# Patient Record
Sex: Female | Born: 1980 | Race: White | Hispanic: No | Marital: Single
Health system: Southern US, Community
[De-identification: ages and names within clinical notes are randomized; demographics above are authoritative.]

---

## 2004-12-25 ENCOUNTER — Ambulatory Visit: Payer: Self-pay | Admitting: Unknown Physician Specialty

## 2005-04-13 ENCOUNTER — Emergency Department: Payer: Self-pay | Admitting: Emergency Medicine

## 2005-05-14 ENCOUNTER — Ambulatory Visit: Payer: Self-pay | Admitting: Unknown Physician Specialty

## 2007-08-11 ENCOUNTER — Emergency Department: Payer: Self-pay | Admitting: Emergency Medicine

## 2010-08-02 HISTORY — PX: AUGMENTATION MAMMAPLASTY: SUR837

## 2014-01-12 ENCOUNTER — Emergency Department: Payer: Self-pay | Admitting: Emergency Medicine

## 2014-02-27 ENCOUNTER — Ambulatory Visit: Payer: Self-pay | Admitting: Neurology

## 2015-08-21 ENCOUNTER — Ambulatory Visit: Payer: BLUE CROSS/BLUE SHIELD | Admitting: Physical Therapy

## 2015-08-25 ENCOUNTER — Ambulatory Visit: Payer: BLUE CROSS/BLUE SHIELD | Admitting: Physical Therapy

## 2015-08-28 ENCOUNTER — Other Ambulatory Visit: Payer: Self-pay | Admitting: Neurology

## 2015-08-28 ENCOUNTER — Encounter: Payer: BLUE CROSS/BLUE SHIELD | Admitting: Physical Therapy

## 2015-08-28 DIAGNOSIS — M542 Cervicalgia: Secondary | ICD-10-CM

## 2015-09-01 ENCOUNTER — Encounter: Payer: BLUE CROSS/BLUE SHIELD | Admitting: Physical Therapy

## 2015-09-04 ENCOUNTER — Encounter: Payer: BLUE CROSS/BLUE SHIELD | Admitting: Physical Therapy

## 2015-09-05 ENCOUNTER — Ambulatory Visit
Admission: RE | Admit: 2015-09-05 | Discharge: 2015-09-05 | Disposition: A | Discharge status: Home / Self Care | Payer: BLUE CROSS/BLUE SHIELD | Source: Ambulatory Visit | Attending: Neurology | Admitting: Neurology

## 2015-09-05 DIAGNOSIS — M542 Cervicalgia: Secondary | ICD-10-CM

## 2016-05-18 ENCOUNTER — Other Ambulatory Visit: Payer: Self-pay | Admitting: Obstetrics and Gynecology

## 2016-05-18 DIAGNOSIS — N6312 Unspecified lump in the right breast, upper inner quadrant: Secondary | ICD-10-CM

## 2016-06-07 ENCOUNTER — Other Ambulatory Visit: Payer: Self-pay | Admitting: Obstetrics and Gynecology

## 2016-06-07 ENCOUNTER — Ambulatory Visit
Admission: RE | Admit: 2016-06-07 | Discharge: 2016-06-07 | Disposition: A | Discharge status: Home / Self Care | Payer: BLUE CROSS/BLUE SHIELD | Source: Ambulatory Visit | Attending: Obstetrics and Gynecology | Admitting: Obstetrics and Gynecology

## 2016-06-07 ENCOUNTER — Encounter: Payer: Self-pay | Admitting: Radiology

## 2016-06-07 DIAGNOSIS — N6002 Solitary cyst of left breast: Secondary | ICD-10-CM | POA: Insufficient documentation

## 2016-06-07 DIAGNOSIS — N6312 Unspecified lump in the right breast, upper inner quadrant: Secondary | ICD-10-CM

## 2016-06-07 DIAGNOSIS — N6489 Other specified disorders of breast: Secondary | ICD-10-CM | POA: Insufficient documentation

## 2016-06-22 ENCOUNTER — Other Ambulatory Visit: Payer: Self-pay | Admitting: Obstetrics and Gynecology

## 2016-06-22 DIAGNOSIS — N63 Unspecified lump in unspecified breast: Secondary | ICD-10-CM

## 2016-12-15 ENCOUNTER — Encounter: Payer: Self-pay | Admitting: Radiology

## 2016-12-15 ENCOUNTER — Ambulatory Visit
Admission: RE | Admit: 2016-12-15 | Discharge: 2016-12-15 | Disposition: A | Discharge status: Home / Self Care | Payer: BLUE CROSS/BLUE SHIELD | Source: Ambulatory Visit | Attending: Obstetrics and Gynecology | Admitting: Obstetrics and Gynecology

## 2016-12-15 DIAGNOSIS — N6320 Unspecified lump in the left breast, unspecified quadrant: Secondary | ICD-10-CM | POA: Diagnosis present

## 2016-12-15 DIAGNOSIS — N63 Unspecified lump in unspecified breast: Secondary | ICD-10-CM

## 2016-12-20 ENCOUNTER — Encounter: Payer: Self-pay | Admitting: Radiology

## 2017-08-23 ENCOUNTER — Other Ambulatory Visit: Payer: Self-pay | Admitting: Internal Medicine

## 2017-08-23 DIAGNOSIS — R109 Unspecified abdominal pain: Secondary | ICD-10-CM

## 2017-08-25 ENCOUNTER — Ambulatory Visit: Payer: BLUE CROSS/BLUE SHIELD

## 2018-03-02 ENCOUNTER — Ambulatory Visit: Payer: BLUE CROSS/BLUE SHIELD

## 2018-05-22 ENCOUNTER — Other Ambulatory Visit: Payer: Self-pay

## 2018-05-22 ENCOUNTER — Emergency Department
Admission: EM | Admit: 2018-05-22 | Discharge: 2018-05-22 | Disposition: A | Discharge status: Home / Self Care | Payer: BLUE CROSS/BLUE SHIELD | Attending: Emergency Medicine | Admitting: Emergency Medicine

## 2018-05-22 ENCOUNTER — Emergency Department: Payer: BLUE CROSS/BLUE SHIELD

## 2018-05-22 DIAGNOSIS — O208 Other hemorrhage in early pregnancy: Secondary | ICD-10-CM

## 2018-05-22 DIAGNOSIS — N939 Abnormal uterine and vaginal bleeding, unspecified: Secondary | ICD-10-CM

## 2018-05-22 DIAGNOSIS — O209 Hemorrhage in early pregnancy, unspecified: Secondary | ICD-10-CM | POA: Diagnosis present

## 2018-05-22 DIAGNOSIS — Z3A01 Less than 8 weeks gestation of pregnancy: Secondary | ICD-10-CM | POA: Insufficient documentation

## 2018-05-22 LAB — CBC
HCT: 42.4 % (ref 36.0–46.0)
HEMOGLOBIN: 14 g/dL (ref 12.0–15.0)
MCH: 30.8 pg (ref 26.0–34.0)
MCHC: 33 g/dL (ref 30.0–36.0)
MCV: 93.4 fL (ref 80.0–100.0)
PLATELETS: 305 10*3/uL (ref 150–400)
RBC: 4.54 MIL/uL (ref 3.87–5.11)
RDW: 11.9 % (ref 11.5–15.5)
WBC: 7.4 10*3/uL (ref 4.0–10.5)
nRBC: 0 % (ref 0.0–0.2)

## 2018-05-22 LAB — COMPREHENSIVE METABOLIC PANEL
ALK PHOS: 49 U/L (ref 38–126)
ALT: 16 U/L (ref 0–44)
AST: 15 U/L (ref 15–41)
Albumin: 4.1 g/dL (ref 3.5–5.0)
Anion gap: 2 — ABNORMAL LOW (ref 5–15)
BUN: 14 mg/dL (ref 6–20)
CHLORIDE: 106 mmol/L (ref 98–111)
CO2: 29 mmol/L (ref 22–32)
CREATININE: 0.75 mg/dL (ref 0.44–1.00)
Calcium: 9 mg/dL (ref 8.9–10.3)
GFR calc Af Amer: >60 mL/min (ref >60)
Glucose, Bld: 106 mg/dL — ABNORMAL HIGH (ref 70–99)
Potassium: 4.6 mmol/L (ref 3.5–5.1)
Sodium: 137 mmol/L (ref 135–145)
Total Bilirubin: 0.4 mg/dL (ref 0.3–1.2)
Total Protein: 7.5 g/dL (ref 6.5–8.1)

## 2018-05-22 LAB — URINALYSIS, COMPLETE (UACMP) WITH MICROSCOPIC
Bacteria, UA: NONE SEEN
Bilirubin Urine: NEGATIVE
Glucose, UA: NEGATIVE mg/dL
Ketones, ur: NEGATIVE mg/dL
Leukocytes, UA: NEGATIVE
Nitrite: NEGATIVE
PROTEIN: NEGATIVE mg/dL
Specific Gravity, Urine: 1.018 (ref 1.005–1.030)
pH: 5 (ref 5.0–8.0)

## 2018-05-22 LAB — HCG, QUANTITATIVE, PREGNANCY: HCG, BETA CHAIN, QUANT, S: 3607 m[IU]/mL — AB (ref <5)

## 2018-05-22 LAB — POCT PREGNANCY, URINE: Preg Test, Ur: POSITIVE — AB

## 2018-05-22 LAB — ABO/RH: ABO/RH(D): A POS

## 2018-05-22 NOTE — ED Provider Notes (Signed)
[facility] Emergency Department Provider Note  ___________________________________________   First MD Initiated Contact with Patient 05/22/18 2124     (approximate)  I have reviewed the triage vital signs and the nursing notes.   HISTORY  Chief Complaint Vaginal Bleeding   HPI Sonya Clarke is a 37 y.o. female without any chronic medical conditions was presented to the emergency department today with vaginal bleeding.  She says that the bleeding started at 8 PM and was associated with mild cramping to the lower abdomen.  She states that she had clots that were several centimeters across.  Does not report any tissue loss.  States this is her first pregnancy and she calculated by her last menses that she was approximately 6 weeks but then went to her OB/GYN appointment this morning and measured at only 5 weeks.   No past medical history on file.  There are no active problems to display for this patient.   Past Surgical History:  Procedure Laterality Date  . AUGMENTATION MAMMAPLASTY Bilateral 2012   SALINE - RETRO    Prior to Admission medications   Not on File    Allergies Sulfa antibiotics  Family History  Problem Relation Age of Onset  . Breast cancer Neg Hx     Social History Social History   Tobacco Use  . Smoking status: Not on file  Substance Use Topics  . Alcohol use: Not on file  . Drug use: Not on file    Review of Systems  Constitutional: No fever/chills Eyes: No visual changes. ENT: No sore throat. Cardiovascular: Denies chest pain. Respiratory: Denies shortness of breath. Gastrointestinal:  No nausea, no vomiting.  No diarrhea.  No constipation. Genitourinary: As above Musculoskeletal: Negative for back pain. Skin: Negative for rash. Neurological: Negative for headaches, focal weakness or numbness.   ____________________________________________   PHYSICAL EXAM:  VITAL SIGNS: ED Triage Vitals [05/22/18  2027]  Enc Vitals Group     BP 135/77     Pulse Rate 67     Resp 20     Temp 97.7 F (36.5 C)     Temp Source Oral     SpO2 100 %     Weight 162 lb (73.5 kg)     Height 5\' 3"  (1.6 m)     Head Circumference      Peak Flow      Pain Score 4     Pain Loc      Pain Edu?      Excl. in GC?     Constitutional: Alert and oriented. Well appearing and in no acute distress. Eyes: Conjunctivae are normal.  Head: Atraumatic. Nose: No congestion/rhinnorhea. Mouth/Throat: Mucous membranes are moist.  Neck: No stridor.   Cardiovascular: Normal rate, regular rhythm. Grossly normal heart sounds.   Respiratory: Normal respiratory effort.  No retractions. Lungs CTAB. Gastrointestinal: Soft and nontender. No distention. Genitourinary: No lesions on the external genitalia.  However, there is a small amount of maroon blood.  On speculum exam there is a small amount of clot at the cervix.  It was removed with a large Q-tip and there is no active bleeding from the cervix.  No discharge visualized.  Bimanual exam with a closed cervical loss.  No uterine or adnexal tenderness nor masses palpated. Musculoskeletal: No lower extremity tenderness nor edema.  No joint effusions. Neurologic:  Normal speech and language. No gross focal neurologic deficits are appreciated. Skin:  Skin is warm, dry and intact. No  rash noted. Psychiatric: Mood and affect are normal. Speech and behavior are normal.  ____________________________________________   LABS (all labs ordered are listed, but only abnormal results are displayed)  Labs Reviewed  COMPREHENSIVE METABOLIC PANEL - Abnormal; Notable for the following components:      Result Value   Glucose, Bld 106 (*)    Anion gap 2 (*)    All other components within normal limits  HCG, QUANTITATIVE, PREGNANCY - Abnormal; Notable for the following components:   hCG, Beta Chain, Quant, S 3,607 (*)    All other components within normal limits  URINALYSIS, COMPLETE (UACMP)  WITH MICROSCOPIC - Abnormal; Notable for the following components:   Color, Urine YELLOW (*)    APPearance HAZY (*)    Hgb urine dipstick LARGE (*)    RBC / HPF >50 (*)    All other components within normal limits  POCT PREGNANCY, URINE - Abnormal; Notable for the following components:   Preg Test, Ur POSITIVE (*)    All other components within normal limits  CBC  POC URINE PREG, ED  ABO/RH   ____________________________________________  EKG   ____________________________________________  RADIOLOGY  Ultrasound with single IUP with visible gestational sac and yolk sac but no embryo.  Follow-up ultrasound suggested in 10 to 14 days. ____________________________________________   PROCEDURES  Procedure(s) performed:   Procedures  Critical Care performed:   ____________________________________________   INITIAL IMPRESSION / ASSESSMENT AND PLAN / ED COURSE  Pertinent labs & imaging results that were available during my care of the patient were reviewed by me and considered in my medical decision making (see chart for details).  Differential diagnosis includes, but is not limited to, threatened miscarriage, incomplete miscarriage, normal bleeding from an early trimester pregnancy, ectopic pregnancy, , blighted ovum, vaginal/cervical trauma, subchorionic hemorrhage/hematoma, etc. As part of my medical decision making, I reviewed the following data within the electronic MEDICAL RECORD NUMBERReviewed outpatient visit earlier today with Dr. Dalbert Garnet.  ----------------------------------------- 10:04 PM on 05/22/2018 -----------------------------------------  Patient has been updated about test results as well as exam findings.  We discussed the pregnancies still appears intact but that this bleeding could progress to miscarriage or the pregnancy could continue and develop normally.  Patient has already been in contact with her OB/GYN and will be following up for further evaluation.  A  positive blood type.  Will not require RhoGam.  Patient be discharged at this time.  We also discussed pelvic rest.  Patient understand the diagnosis as well as treatment plan willing to comply.  Patient taking prenatal vitamins at this time. ____________________________________________   FINAL CLINICAL IMPRESSION(S) / ED DIAGNOSES  Final diagnoses:  Vaginal bleeding   First trimester vaginal bleeding.   NEW MEDICATIONS STARTED DURING THIS VISIT:  New Prescriptions   No medications on file     Note:  This document was prepared using Dragon voice recognition software and may include unintentional dictation errors.     Myrna Blazer, MD 05/22/18 [phone]

## 2018-05-22 NOTE — ED Notes (Signed)
Pt ambulatory to POV without difficulty. VSS. NAD. Discharge instructions, and follow up discussed. All questions and concerns addressed.  

## 2018-05-22 NOTE — ED Triage Notes (Signed)
Pt is [redacted] weeks pregnant with vaginal bleeding that started today. States P-1 also having abd cramping.

## 2019-12-04 ENCOUNTER — Other Ambulatory Visit: Payer: Self-pay | Admitting: Internal Medicine

## 2019-12-04 DIAGNOSIS — R1032 Left lower quadrant pain: Secondary | ICD-10-CM

## 2020-09-19 ENCOUNTER — Other Ambulatory Visit: Payer: Self-pay | Admitting: Internal Medicine

## 2020-09-19 DIAGNOSIS — R1010 Upper abdominal pain, unspecified: Secondary | ICD-10-CM

## 2020-09-24 ENCOUNTER — Ambulatory Visit: Payer: 59 | Attending: Internal Medicine

## 2021-05-28 ENCOUNTER — Other Ambulatory Visit
Admission: RE | Admit: 2021-05-28 | Discharge: 2021-05-28 | Disposition: A | Discharge status: Home / Self Care | Payer: 59 | Source: Ambulatory Visit | Attending: Pulmonary Disease | Admitting: Pulmonary Disease

## 2021-05-28 DIAGNOSIS — R0689 Other abnormalities of breathing: Secondary | ICD-10-CM | POA: Diagnosis present

## 2021-05-28 DIAGNOSIS — R0600 Dyspnea, unspecified: Secondary | ICD-10-CM | POA: Diagnosis present

## 2021-05-28 LAB — D-DIMER, QUANTITATIVE: D-Dimer, Quant: 0.54 ug/mL-FEU — ABNORMAL HIGH (ref 0.00–0.50)

## 2021-05-29 ENCOUNTER — Other Ambulatory Visit: Payer: Self-pay | Admitting: Pulmonary Disease

## 2021-05-29 DIAGNOSIS — R0602 Shortness of breath: Secondary | ICD-10-CM

## 2021-05-29 DIAGNOSIS — R7989 Other specified abnormal findings of blood chemistry: Secondary | ICD-10-CM

## 2021-06-03 ENCOUNTER — Ambulatory Visit
Admission: RE | Admit: 2021-06-03 | Discharge: 2021-06-03 | Disposition: A | Discharge status: Home / Self Care | Payer: 59 | Source: Ambulatory Visit | Attending: Pulmonary Disease | Admitting: Pulmonary Disease

## 2021-06-03 ENCOUNTER — Other Ambulatory Visit: Payer: Self-pay

## 2021-06-03 DIAGNOSIS — R0602 Shortness of breath: Secondary | ICD-10-CM | POA: Diagnosis present

## 2021-06-03 DIAGNOSIS — R7989 Other specified abnormal findings of blood chemistry: Secondary | ICD-10-CM | POA: Diagnosis not present

## 2021-06-03 MED ORDER — IOHEXOL 350 MG/ML SOLN
75.0000 mL | Freq: Once | INTRAVENOUS | Status: AC | PRN
  Administered 2021-06-03: 75 mL via INTRAVENOUS

## 2021-06-08 ENCOUNTER — Ambulatory Visit
Admission: RE | Admit: 2021-06-08 | Discharge: 2021-06-08 | Disposition: A | Discharge status: Home / Self Care | Payer: 59 | Source: Ambulatory Visit | Attending: Internal Medicine | Admitting: Internal Medicine

## 2021-06-08 ENCOUNTER — Other Ambulatory Visit: Payer: Self-pay

## 2021-06-08 DIAGNOSIS — R1010 Upper abdominal pain, unspecified: Secondary | ICD-10-CM | POA: Diagnosis present

## 2021-08-05 DIAGNOSIS — K2970 Gastritis, unspecified, without bleeding: Secondary | ICD-10-CM | POA: Diagnosis not present

## 2021-08-05 DIAGNOSIS — K449 Diaphragmatic hernia without obstruction or gangrene: Secondary | ICD-10-CM | POA: Diagnosis not present

## 2021-08-05 DIAGNOSIS — K2950 Unspecified chronic gastritis without bleeding: Secondary | ICD-10-CM | POA: Diagnosis not present

## 2021-08-12 DIAGNOSIS — Z79899 Other long term (current) drug therapy: Secondary | ICD-10-CM | POA: Diagnosis not present

## 2021-08-12 DIAGNOSIS — R002 Palpitations: Secondary | ICD-10-CM | POA: Diagnosis not present

## 2021-08-12 DIAGNOSIS — E538 Deficiency of other specified B group vitamins: Secondary | ICD-10-CM | POA: Diagnosis not present

## 2021-08-12 DIAGNOSIS — R69 Illness, unspecified: Secondary | ICD-10-CM | POA: Diagnosis not present

## 2021-09-30 DIAGNOSIS — F419 Anxiety disorder, unspecified: Secondary | ICD-10-CM | POA: Diagnosis not present

## 2021-09-30 DIAGNOSIS — R69 Illness, unspecified: Secondary | ICD-10-CM | POA: Diagnosis not present

## 2021-09-30 DIAGNOSIS — E538 Deficiency of other specified B group vitamins: Secondary | ICD-10-CM | POA: Diagnosis not present

## 2021-09-30 DIAGNOSIS — F321 Major depressive disorder, single episode, moderate: Secondary | ICD-10-CM | POA: Diagnosis not present

## 2021-09-30 DIAGNOSIS — G44229 Chronic tension-type headache, not intractable: Secondary | ICD-10-CM | POA: Diagnosis not present

## 2021-11-02 DIAGNOSIS — E538 Deficiency of other specified B group vitamins: Secondary | ICD-10-CM | POA: Diagnosis not present

## 2021-11-25 DIAGNOSIS — E538 Deficiency of other specified B group vitamins: Secondary | ICD-10-CM | POA: Diagnosis not present

## 2021-11-25 DIAGNOSIS — G44229 Chronic tension-type headache, not intractable: Secondary | ICD-10-CM | POA: Diagnosis not present

## 2021-11-25 DIAGNOSIS — R69 Illness, unspecified: Secondary | ICD-10-CM | POA: Diagnosis not present

## 2021-11-25 DIAGNOSIS — R0602 Shortness of breath: Secondary | ICD-10-CM | POA: Diagnosis not present

## 2021-11-25 DIAGNOSIS — Z79899 Other long term (current) drug therapy: Secondary | ICD-10-CM | POA: Diagnosis not present

## 2021-11-25 DIAGNOSIS — R7989 Other specified abnormal findings of blood chemistry: Secondary | ICD-10-CM | POA: Diagnosis not present

## 2021-11-25 DIAGNOSIS — R55 Syncope and collapse: Secondary | ICD-10-CM | POA: Diagnosis not present

## 2021-12-04 DIAGNOSIS — E538 Deficiency of other specified B group vitamins: Secondary | ICD-10-CM | POA: Diagnosis not present

## 2022-01-08 DIAGNOSIS — E538 Deficiency of other specified B group vitamins: Secondary | ICD-10-CM | POA: Diagnosis not present

## 2022-03-19 ENCOUNTER — Other Ambulatory Visit: Payer: Self-pay | Admitting: Internal Medicine

## 2022-03-19 DIAGNOSIS — N6341 Unspecified lump in right breast, subareolar: Secondary | ICD-10-CM

## 2022-03-22 ENCOUNTER — Other Ambulatory Visit: Payer: Self-pay | Admitting: Internal Medicine

## 2022-03-22 DIAGNOSIS — N6341 Unspecified lump in right breast, subareolar: Secondary | ICD-10-CM

## 2022-04-12 ENCOUNTER — Other Ambulatory Visit: Payer: Self-pay | Admitting: Internal Medicine

## 2022-04-12 ENCOUNTER — Ambulatory Visit
Admission: RE | Admit: 2022-04-12 | Discharge: 2022-04-12 | Disposition: A | Discharge status: Home / Self Care | Payer: 59 | Source: Ambulatory Visit | Attending: Internal Medicine | Admitting: Internal Medicine

## 2022-04-12 DIAGNOSIS — N6341 Unspecified lump in right breast, subareolar: Secondary | ICD-10-CM

## 2022-08-23 IMAGING — US US ABDOMEN COMPLETE
1 series · 15 of 25 positions shown · non-contrast
Comparison: None.

CLINICAL DATA: Upper abdominal pain

EXAM:
ABDOMEN ULTRASOUND COMPLETE

[Series 1: us abdomen complete · 15 of 71 slices shown]
[im 1/71]
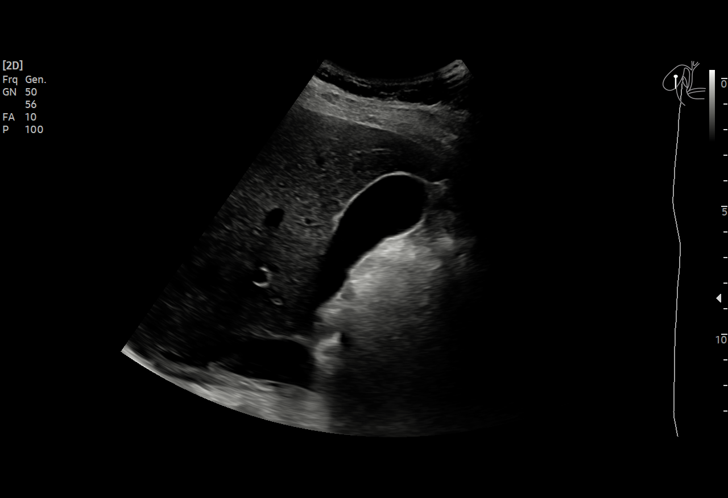
[im 6/71]
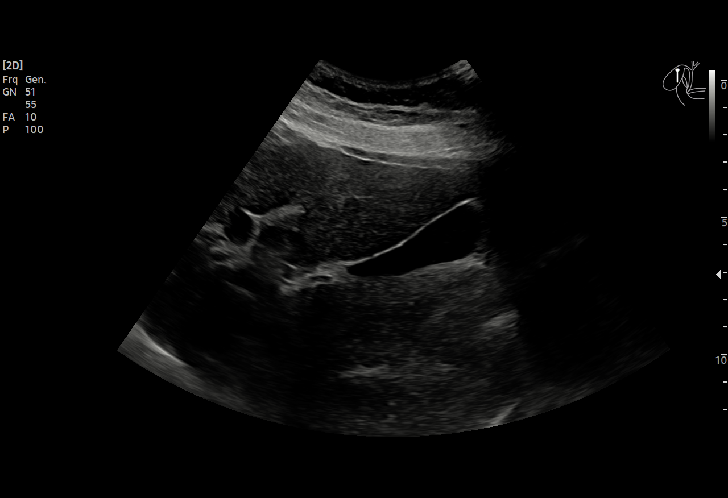
[im 12/71]
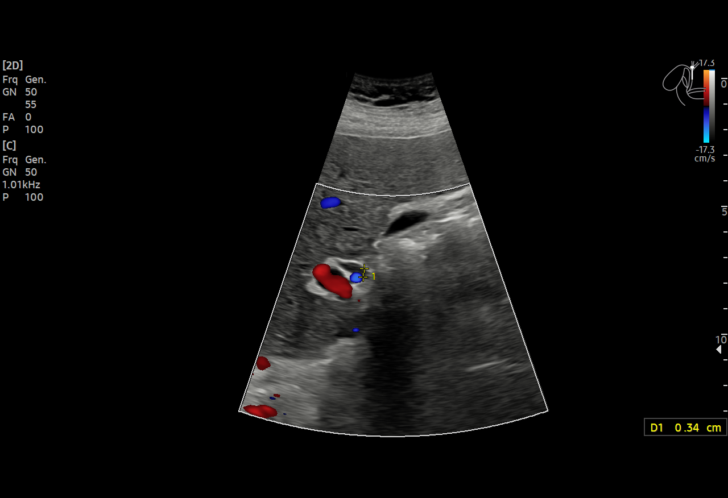
[im 15/71]
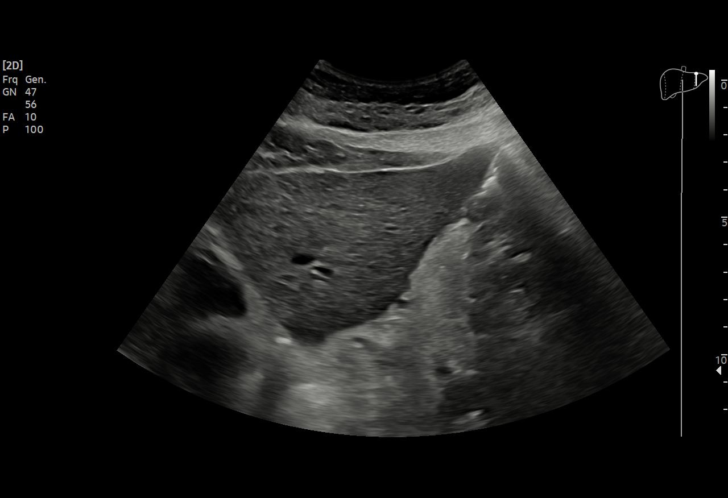
[im 21/71]
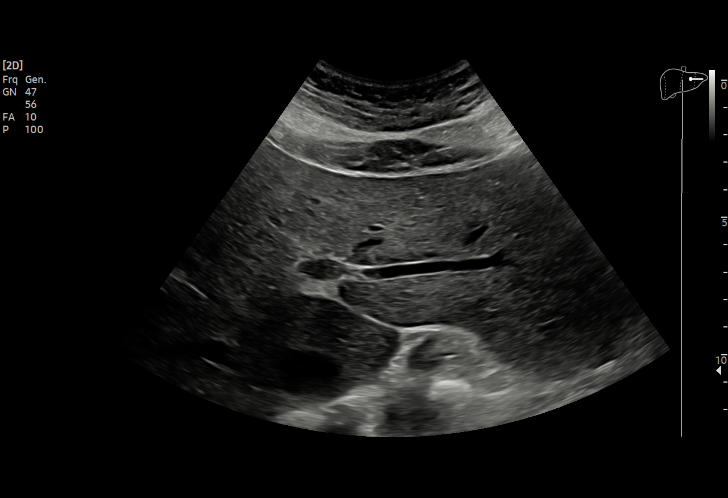
[im 27/71]
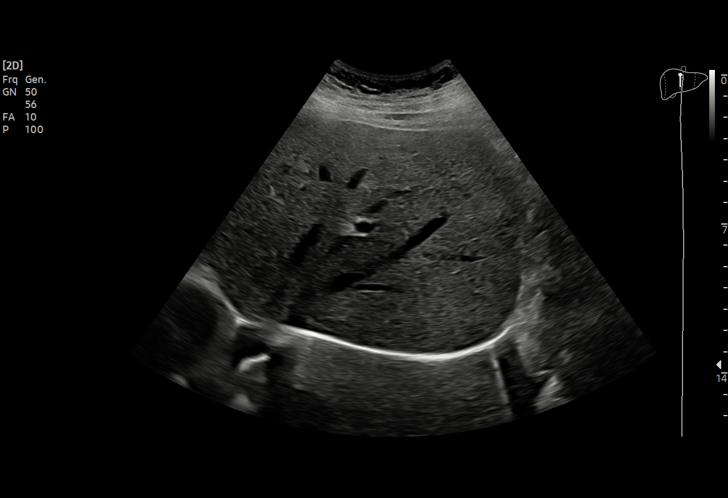
[im 30/71]
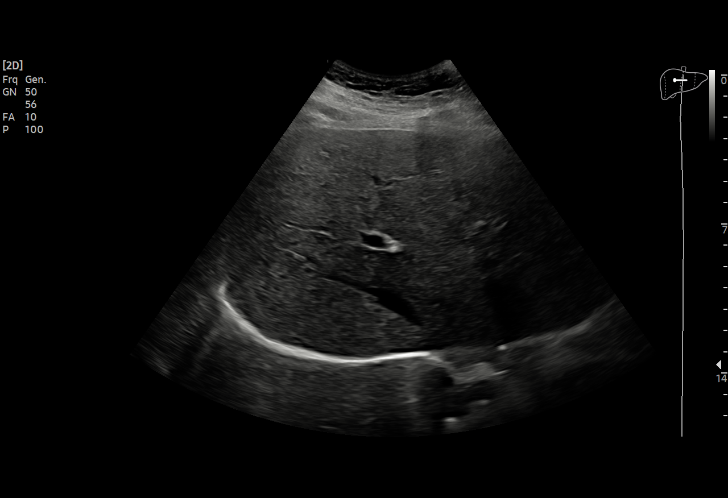
[im 36/71]
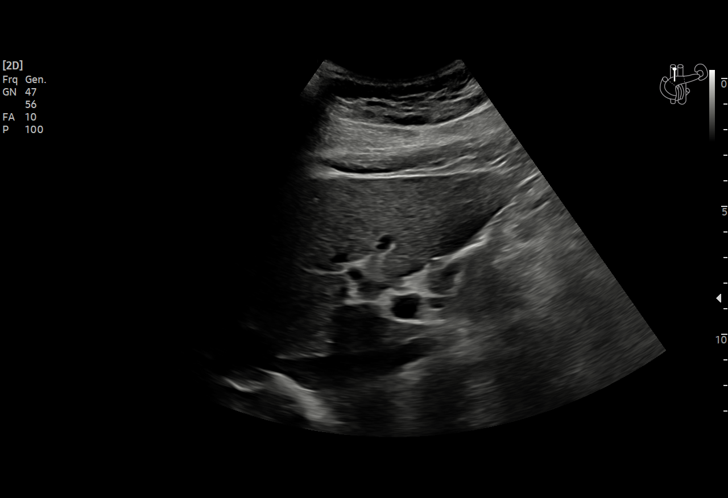
[im 41/71]
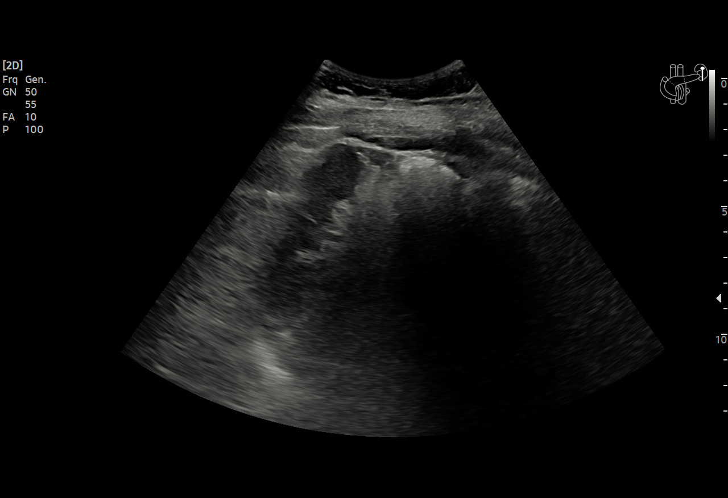
[im 44/71]
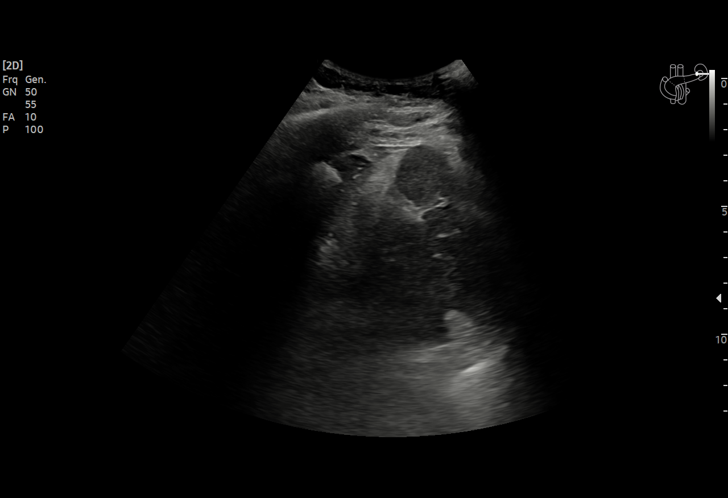
[im 50/71]
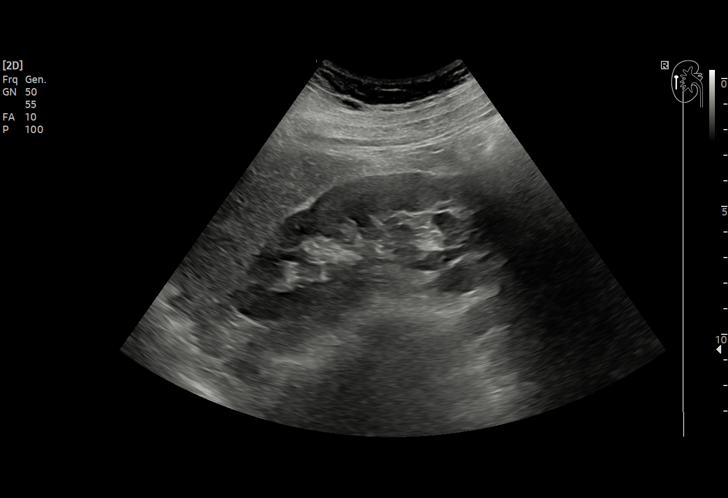
[im 56/71]
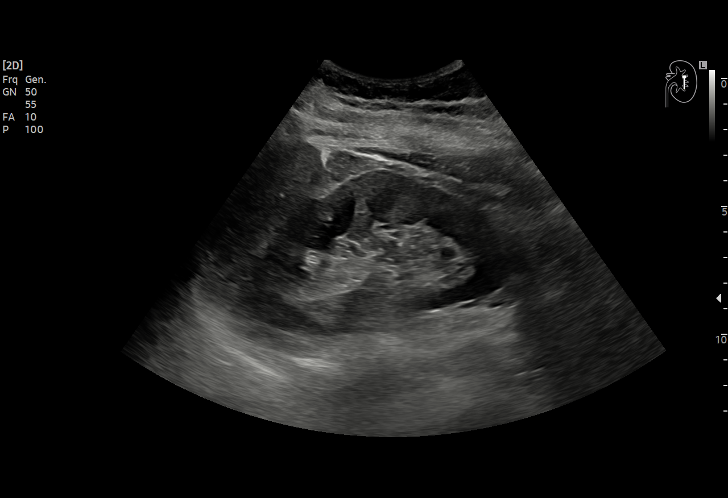
[im 59/71]
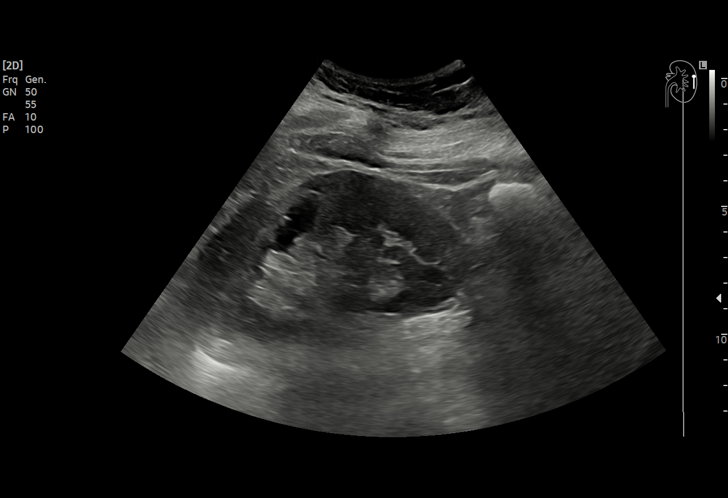
[im 65/71]
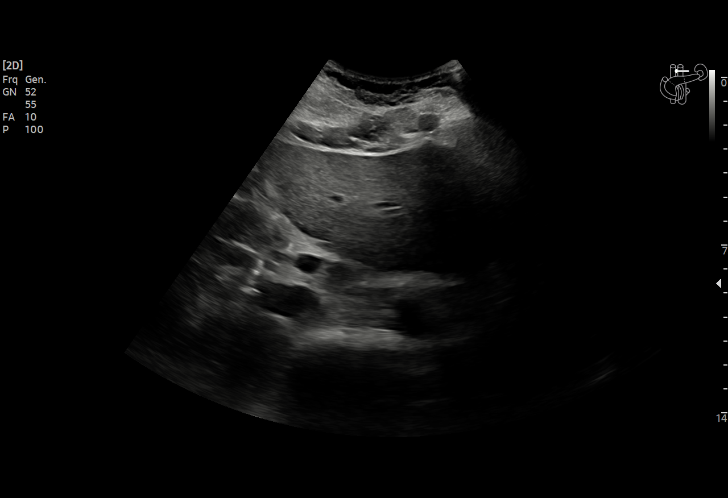
[im 71/71]
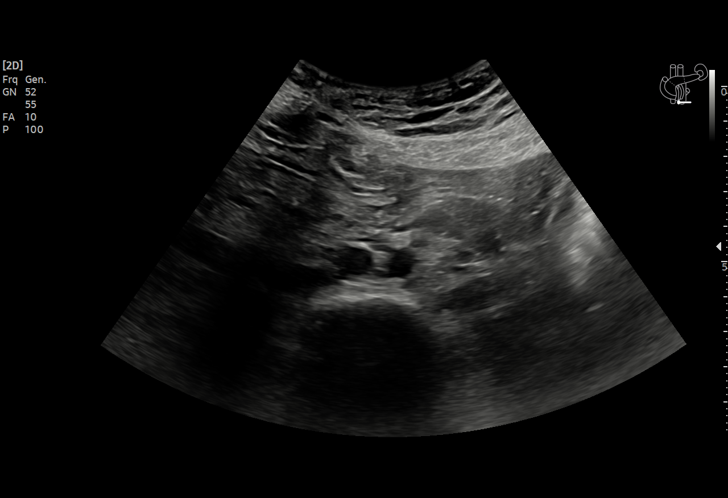

[15 of 25 positions shown; findings below may reference images not displayed]

FINDINGS: Gallbladder: No gallstones or wall thickening visualized. No
sonographic Murphy sign noted by sonographer.

Common bile duct: Diameter: 3 mm

Liver: Liver slightly echogenic. No focal hepatic abnormality.
Portal vein is patent on color Doppler imaging with normal direction
of blood flow towards the liver.

IVC: No abnormality visualized.

Pancreas: Visualized portion unremarkable.

Spleen: Size and appearance within normal limits.

Right Kidney: Length: 10.5 cm. Echogenicity within normal limits. No
mass or hydronephrosis visualized.

Left Kidney: Length: 10.7 cm. Echogenicity within normal limits. No
mass or hydronephrosis visualized.

Abdominal aorta: No aneurysm visualized.

Other findings: None.
IMPRESSION: 1. Negative for gallstones.
2. Slightly echogenic liver as may be seen with steatosis.

## 2023-11-09 ENCOUNTER — Emergency Department

## 2023-11-09 ENCOUNTER — Other Ambulatory Visit: Payer: Self-pay

## 2023-11-09 DIAGNOSIS — T8543XA Leakage of breast prosthesis and implant, initial encounter: Secondary | ICD-10-CM | POA: Diagnosis present

## 2023-11-09 DIAGNOSIS — N644 Mastodynia: Secondary | ICD-10-CM | POA: Insufficient documentation

## 2023-11-09 NOTE — ED Triage Notes (Signed)
 Pt reports her left breast implant ruptured, pt states she has had bilateral breast pain all day today. Pt is on day 1 of her menstrual cycle. Pt noticed her left breast smaller than right.

## 2023-11-10 ENCOUNTER — Other Ambulatory Visit: Payer: Self-pay

## 2023-11-10 ENCOUNTER — Emergency Department
Admission: EM | Admit: 2023-11-10 | Discharge: 2023-11-10 | Disposition: A | Discharge status: Home / Self Care | Attending: Emergency Medicine | Admitting: Emergency Medicine

## 2023-11-10 DIAGNOSIS — T8543XA Leakage of breast prosthesis and implant, initial encounter: Secondary | ICD-10-CM

## 2023-11-10 NOTE — ED Provider Notes (Signed)
 [name]    Event Date/Time   First MD Initiated Contact with Patient 11/10/23 0246     (approximate)   History   Breast Problem   HPI  Sonya Clarke is a 43 year old female presenting to the emergency department for evaluation of breast discomfort.  Patient reports that she is currently on her menstrual cycle and had discomfort over her bilateral breasts.  She was massaging her left breast when she felt like her implant ruptured.  Since that time, her left breast does not feel as full as her right side.  Reports mild discomfort in this area, otherwise denies chest pain or shortness of breath.  Reports that she had bilateral saline breast implants placed in 2012 by a doctor in [place].  No history of prior complications.    Physical Exam   Triage Vital Signs: ED Triage Vitals  Encounter Vitals Group     BP 11/09/23 2341 (!) 147/89     Systolic BP Percentile --      Diastolic BP Percentile --      Pulse Rate 11/09/23 2341 83     Resp 11/09/23 2341 18     Temp 11/09/23 2341 99.1 F (37.3 C)     Temp Source 11/09/23 2341 Oral     SpO2 11/09/23 2341 100 %     Weight 11/09/23 2340 161 lb (73 kg)     Height 11/09/23 2340 5\' 3"  (1.6 m)     Head Circumference --      Peak Flow --      Pain Score 11/09/23 2340 0     Pain Loc --      Pain Education --      Exclude from Growth Chart --     Most recent vital signs: Vitals:   11/09/23 2341 11/10/23 0300  BP: (!) 147/89 106/85  Pulse: 83 (!) 56  Resp: 18 17  Temp: 99.1 F (37.3 C)   SpO2: 100% 96%     General: Awake, interactive  CV:  Regular rate, good peripheral perfusion.  Breast:  There is increased fullness to palpation over the superior aspect of the right breast compared to the left.  There are no palpable masses, overlying skin changes, nipple drainage. Resp:  Unlabored respirations, lungs clear to auscultation Abd:  Nondistended.  Neuro:  Symmetric facial  movement, fluid speech   ED Results / Procedures / Treatments   Labs (all labs ordered are listed, but only abnormal results are displayed) Labs Reviewed - No data to display   EKG EKG independently reviewed interpreted by myself (ER attending) demonstrates:  EKG demonstrates normal sinus rhythm rate of 62, PR 148, QRS 72, QTc 383, no acute ST changes  RADIOLOGY Imaging independently reviewed and interpreted by myself demonstrates:  CXR without acute cardiopulmonary process.  X-Sonya Clarke demonstrates bilateral breast implants in place without visible complication by x-Sonya Clarke.  PROCEDURES:  Critical Care performed: No  Procedures   MEDICATIONS ORDERED IN ED: Medications - No data to display   IMPRESSION / MDM / ASSESSMENT AND PLAN / ED COURSE  I reviewed the triage vital signs and the nursing notes.  Differential diagnosis includes, but is not limited to, breast implant rupture, clinical presentation not suggestive of ACS, pneumonia, pneumothorax, other acute cardiopulmonary process  Patient's presentation is most consistent with acute complicated illness / injury requiring diagnostic workup.  44 year old female presenting with concerns for breast implant rupture.  EKG and x-Chestine Clarke reassuring.  Discussed that  imaging modalities are limited for evaluation of breast implant rupture, but clinically her exam is concerning for this.  However, vitals are reassuring, do feel she is stable for discharge with outpatient follow-up with her surgeon.  Patient is comfortable with this plan.  Strict return precautions provided.  Patient discharged stable condition.     FINAL CLINICAL IMPRESSION(S) / ED DIAGNOSES   Final diagnoses:  Breast implant rupture, initial encounter     Rx / DC Orders   ED Discharge Orders     None        Note:  This document was prepared using Dragon voice recognition software and may include unintentional dictation errors.   Sonya Post, MD 11/10/23 [phone]

## 2023-11-10 NOTE — Discharge Instructions (Signed)
 You are seen in the ER with concerns for rupture of your breast implant.  Please follow-up with your surgical team for further evaluation of this.  Return to the ER for new or worsening symptoms.

## 2023-11-17 ENCOUNTER — Other Ambulatory Visit: Payer: Self-pay | Admitting: Internal Medicine

## 2023-11-17 DIAGNOSIS — T8543XA Leakage of breast prosthesis and implant, initial encounter: Secondary | ICD-10-CM

## 2024-02-10 ENCOUNTER — Ambulatory Visit
Admission: RE | Admit: 2024-02-10 | Discharge: 2024-02-10 | Disposition: A | Discharge status: Home / Self Care | Source: Ambulatory Visit | Attending: Internal Medicine | Admitting: Internal Medicine

## 2024-02-10 DIAGNOSIS — T8543XA Leakage of breast prosthesis and implant, initial encounter: Secondary | ICD-10-CM

## 2024-02-14 ENCOUNTER — Encounter: Payer: Self-pay | Admitting: Plastic Surgery

## 2024-02-14 ENCOUNTER — Ambulatory Visit (INDEPENDENT_AMBULATORY_CARE_PROVIDER_SITE_OTHER): Payer: Self-pay | Admitting: Plastic Surgery

## 2024-02-14 VITALS — BP 124/83 | HR 92 | Ht 63.0 in | Wt 162.0 lb

## 2024-02-14 DIAGNOSIS — T8543XA Leakage of breast prosthesis and implant, initial encounter: Secondary | ICD-10-CM | POA: Insufficient documentation

## 2024-02-14 NOTE — Progress Notes (Signed)
 Patient ID: Sonya Clarke, female    DOB: [birth year], 43 y.o.   MRN: [id number]   Chief Complaint  Patient presents with   Advice Only    The patient is a 43 year old female here for evaluation of her breasts.  She is 5 feet 3 inches tall weighs 162 pounds.  In 2012 she had implants placed by Dr. Verneda.  It appears that they were placed through an inframammary incision.  They are saline.  She is not sure if they are under or above the muscle.  Approximately April she had a rupture of the left breast implant and now has some asymmetry.  She has had surgery in the past including her eyes her wisdom teeth and a left knee.  She did not have complications with those.  Prior to her implant placement she was around a B size cup and she is now at 38D.  She is wanting the implants removed and replaced.  She expressed that she was at Dr. Sherma old office recently and got a quote.  She expressed some difficulties with finances due to an ex-boyfriend/fianc that was ill.  She asked about financing which I explained that we do not have any financing for cosmetic surgery.    Review of Systems  Constitutional: Negative.   HENT: Negative.    Eyes: Negative.   Respiratory: Negative.    Cardiovascular: Negative.   Gastrointestinal: Negative.   Endocrine: Negative.   Genitourinary: Negative.   Musculoskeletal: Negative.     No past medical history on file.  Past Surgical History:  Procedure Laterality Date   AUGMENTATION MAMMAPLASTY Bilateral 2012   SALINE - RETRO      Current Outpatient Medications:    busPIRone (BUSPAR) 10 MG tablet, Take 10 mg by mouth 2 (two) times daily., Disp: , Rfl:    FLUoxetine (PROZAC) 40 MG capsule, Take 40 mg by mouth daily., Disp: , Rfl:    lamoTRIgine (LAMICTAL) 150 MG tablet, Take 150 mg by mouth daily., Disp: , Rfl:    methylphenidate 54 MG PO CR tablet, Take 54 mg by mouth daily., Disp: , Rfl:    omeprazole (PRILOSEC) 40 MG capsule, Take 40 mg by  mouth., Disp: , Rfl:    Objective:   Vitals:   02/14/24 1402  BP: 124/83  Pulse: 92  SpO2: 100%    Physical Exam Vitals reviewed.  Constitutional:      Appearance: Normal appearance.  HENT:     Head: Atraumatic.  Cardiovascular:     Rate and Rhythm: Normal rate.     Pulses: Normal pulses.  Pulmonary:     Effort: Pulmonary effort is normal.  Abdominal:     Palpations: Abdomen is soft.  Skin:    General: Skin is warm.     Capillary Refill: Capillary refill takes less than 2 seconds.  Neurological:     Mental Status: She is alert and oriented to person, place, and time.  Psychiatric:        Mood and Affect: Mood normal.        Behavior: Behavior normal.        Thought Content: Thought content normal.     Assessment & Plan:  Rupture of implant of left breast, initial encounter  The patient would like a quote which I am happy to send her through MyChart.   Patient would also like to see if insurance will cover removal of the implants.  Her overall goal is for removal and  replacement of implants. We reviewed the options for surgery and the patient expressed financial challenges.  She then became quite distraught and wanted to know what the other options were for financial assistance.  I said I would be willing to see if insurance would cover the removal of the implant since its ruptured.  I do not believe the insurance will cover placement of any implants since this is not cancer related.  She then got even more distraught and wanted to have the fluid removed from the right breast implant.  This was offered earlier in the visit and the patient did not want to do that so I did not feel comfortable doing it just a few minutes later.  The patient was asked to get dressed and that we would submit this to insurance.  She was resistant to the idea of leaving.  The nurse was able to calm her down and we will submit this to insurance.  Pictures were obtained of the patient and placed in  the chart with the patient's or guardian's permission.   Estefana RAMAN Arrielle Mcginn, DO

## 2024-03-06 ENCOUNTER — Encounter: Payer: Self-pay | Admitting: Plastic Surgery

## 2024-03-06 ENCOUNTER — Ambulatory Visit (INDEPENDENT_AMBULATORY_CARE_PROVIDER_SITE_OTHER): Admitting: Plastic Surgery

## 2024-03-06 ENCOUNTER — Telehealth: Payer: Self-pay | Admitting: Plastic Surgery

## 2024-03-06 DIAGNOSIS — T8543XA Leakage of breast prosthesis and implant, initial encounter: Secondary | ICD-10-CM

## 2024-03-06 NOTE — Progress Notes (Signed)
 The patient is a 43 year old female joining me by phone.  As far as I know she is in [place]  and I am at the office.  We spent 5 minutes in discussion.  I made sure I was speaking to the right person and I recognized her voice.  She remembered the visit as well from July.  The patient asks more questions about the implants and the options.  She was thinking maybe she could just replace the ruptured implant.  That certainly is an option but I personally do not advise it because the implants are as old as 2012.  My concern is that she will have a rupture of the other 1 at some proximal time and then be disappointed she has to pay to have another 1 changed out.  So definitely something to think about.  We remain available when she decides for further discussion.

## 2024-03-06 NOTE — Telephone Encounter (Signed)
 Patient would to know if you could drain the R brst and if so, could you send a quote, please advise and reach out?
